# Patient Record
Sex: Male | Born: 2000 | Race: Black or African American | Hispanic: No | Marital: Single | Smoking: Never smoker
Health system: Southern US, Community
[De-identification: ages and names within clinical notes are randomized; demographics above are authoritative.]

---
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
## 2024-01-27 ENCOUNTER — Ambulatory Visit: Payer: PRIVATE HEALTH INSURANCE | Admitting: Family Medicine

## 2024-01-27 NOTE — Progress Notes (Signed)
 Pt will need to reschedule new pt appt.  While they checked in at 3:10 pm for their 3:00 pm appt advised that it is not fair to this provider or other pts who have arrived on time.

## 2024-02-06 ENCOUNTER — Ambulatory Visit: Admitting: Family Medicine

## 2024-07-07 ENCOUNTER — Ambulatory Visit: Admitting: Family Medicine

## 2024-08-11 ENCOUNTER — Ambulatory Visit: Admitting: Family Medicine
